# Patient Record
Sex: Male | Born: 1970 | Hispanic: Yes | Marital: Single | Smoking: Former smoker
Health system: Southern US, Community
[De-identification: ages and names within clinical notes are randomized; demographics above are authoritative.]

## PROBLEM LIST (undated history)

## (undated) DIAGNOSIS — K649 Unspecified hemorrhoids: Secondary | ICD-10-CM

## (undated) DIAGNOSIS — J189 Pneumonia, unspecified organism: Secondary | ICD-10-CM

## (undated) DIAGNOSIS — F419 Anxiety disorder, unspecified: Secondary | ICD-10-CM

## (undated) HISTORY — PX: HERNIA REPAIR: SHX51

---

## 2014-09-30 ENCOUNTER — Emergency Department (HOSPITAL_COMMUNITY): Payer: Worker's Compensation

## 2014-09-30 ENCOUNTER — Emergency Department (HOSPITAL_COMMUNITY)
Admission: EM | Admit: 2014-09-30 | Discharge: 2014-09-30 | Disposition: A | Discharge status: Home / Self Care | Payer: Worker's Compensation | Attending: Emergency Medicine | Admitting: Emergency Medicine

## 2014-09-30 ENCOUNTER — Encounter (HOSPITAL_COMMUNITY): Payer: Self-pay | Admitting: *Deleted

## 2014-09-30 DIAGNOSIS — Y9389 Activity, other specified: Secondary | ICD-10-CM | POA: Diagnosis not present

## 2014-09-30 DIAGNOSIS — Y998 Other external cause status: Secondary | ICD-10-CM | POA: Insufficient documentation

## 2014-09-30 DIAGNOSIS — Y9289 Other specified places as the place of occurrence of the external cause: Secondary | ICD-10-CM | POA: Insufficient documentation

## 2014-09-30 DIAGNOSIS — S63501A Unspecified sprain of right wrist, initial encounter: Secondary | ICD-10-CM

## 2014-09-30 DIAGNOSIS — S6991XA Unspecified injury of right wrist, hand and finger(s), initial encounter: Secondary | ICD-10-CM | POA: Diagnosis present

## 2014-09-30 DIAGNOSIS — W208XXA Other cause of strike by thrown, projected or falling object, initial encounter: Secondary | ICD-10-CM | POA: Diagnosis not present

## 2014-09-30 MED ORDER — HYDROCODONE-ACETAMINOPHEN 5-325 MG PO TABS: 2.0000 | ORAL_TABLET | ORAL | Status: DC | PRN

## 2014-09-30 NOTE — Progress Notes (Signed)
Orthopedic Tech Progress Note Patient Details:  Vincent Wang 09/30/1970 [id number]  Ortho Devices Type of Ortho Device: Thumb velcro splint Ortho Device/Splint Location: RUE Ortho Device/Splint Interventions: Application   Asia R Thompson 09/30/2014, 1:53 PM

## 2014-09-30 NOTE — ED Notes (Signed)
Phlebotomy called to come do urine drug test for workers comp per patient employer request.

## 2014-09-30 NOTE — ED Notes (Signed)
Pt in c/o injury to his right wrist, possible deformity noted, able to move all fingers but states he cannot bend his wrist down due to pain, sensation intact, no distress noted

## 2014-09-30 NOTE — ED Provider Notes (Signed)
CSN: [id number]     Arrival date & time 09/30/14  1206 History  This chart was scribed for Trisha MangleKaren Sophia, PA-C, working with Purvis SheffieldForrest Harrison, MD by Elon SpannerGarrett Cook, ED Scribe. This patient was seen in room TR09C/TR09C and the patient's care was started at 1:29 PM.   Chief Complaint  Patient presents with  . Wrist Injury   The history is provided by the patient. No language interpreter was used.   HPI Comments: Vincent Wang is a 44 y.o. male who presents to the Emergency Department complaining of a right wrist injury sustained 4.5 hours ago as the gate of a truck fell onto his thumb, causing his thumb and wrist to hyperextend.  He reports the pain the right wrist and thumb that is worsened with any motion.  He has not taken anything for this complaint.  Patient denies previous history of cyst.    History reviewed. No pertinent past medical history. History reviewed. No pertinent past surgical history. History reviewed. No pertinent family history. History  Substance Use Topics  . Smoking status: Never Smoker   . Smokeless tobacco: Not on file  . Alcohol Use: Not on file    Review of Systems  Musculoskeletal: Positive for arthralgias.  All other systems reviewed and are negative.     Allergies  Review of patient's allergies indicates no known allergies.  Home Medications   Prior to Admission medications   Not on File   BP 148/96 mmHg  Pulse 100  Temp(Src) 98.5 F (36.9 C) (Oral)  Resp 18  SpO2 97% Physical Exam  Constitutional: He is oriented to person, place, and time. He appears well-developed and well-nourished. No distress.  HENT:  Head: Normocephalic and atraumatic.  Eyes: Conjunctivae and EOM are normal.  Neck: Neck supple. No tracheal deviation present.  Cardiovascular: Normal rate.   Pulmonary/Chest: Effort normal. No respiratory distress.  Musculoskeletal: Normal range of motion.  Dime sized, swollen, raised, cystic feeling area on wrist below thumb.  Decreased  thumb flexion and decreased ROM of wrist.  NVI.  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    Neurological: He is alert and oriented to person, place, and time.  Skin: Skin is warm and dry.  Psychiatric: He has a normal mood and affect. His behavior is normal.  Nursing note and vitals reviewed.   ED Course  Procedures (including critical care time)  DIAGNOSTIC STUDIES: Oxygen Saturation is 97% on RA, normal by my interpretation.    COORDINATION OF CARE:  1:35 PM Discussed treatment plan with patient at bedside.  Patient acknowledges and agrees with plan.    Labs Review Labs Reviewed - No data to display  Imaging Review Dg Wrist Complete Right  09/30/2014   CLINICAL DATA:  Distal wrist pain, injury at work trying to open a trailer door  EXAM: RIGHT WRIST - COMPLETE 3+ VIEW  COMPARISON:  None.  FINDINGS: Four views of the right wrist submitted. No acute fracture or subluxation. No radiopaque foreign body.  IMPRESSION: Negative.   Electronically Signed   By: Natasha MeadLiviu  Pop M.D.   On: 09/30/2014 13:01   Dg Hand Complete Right  09/30/2014   CLINICAL DATA:  Injury at work trying to open a trailer door, prior injury of fourth finger  EXAM: RIGHT HAND - COMPLETE 3+ VIEW  COMPARISON:  None.  FINDINGS: Three views of the right hand submitted. No acute fracture or subluxation. There is prior amputation of distal phalanx fourth finger.  IMPRESSION: Negative.  Electronically Signed   By: Natasha MeadLiviu  Pop M.D.   On: 09/30/2014 13:02     EKG Interpretation None      MDM   Final diagnoses:  Wrist sprain, right, initial encounter    Schedule to see Dr. Amanda PeaGramig for evaluation.   Return if any problems. Thumb spica splint Hydrocodone    Elson AreasLeslie K Sofia, PA-C 09/30/14 1515  Purvis SheffieldForrest Harrison, MD 09/30/14 [phone]

## 2014-09-30 NOTE — ED Notes (Signed)
Called orthopedics, to place black velcro thumb spica splint.   Currently, we do not have any in med room.   Ortho on their way.

## 2014-09-30 NOTE — Discharge Instructions (Signed)
Esguince de los ligamentos (Ligament Sprain) Los ligamentos son tejidos fibrosos y resistentes que sostienen los huesos juntos en las articulaciones. Cuando un ligamento se estira, puede ocurrir un esguince. Esta lesin puede tardar varias semanas en curarse.  INSTRUCCIONES PARA EL CUIDADO DOMICILIARIO  Mantenga en reposo la zona afectada durante el tiempo que se lo indique su mdico. Luego comience a Chemical engineerutilizar esa articulacin lentamente del modo en que se lo indic el mdico y a Dentistmedida que el dolor se lo permita.  Si es posible, mantenga la articulacin afectada elevada para disminuir la hinchazn.  Aplique hielo durante 15 a 20 minutos para Engineer, materialsaliviar el dolor cada dos horas durante el primer medio da, luego 3 a 4 veces por Environmental consultantda durante las primeras 48 horas. Ponga el hielo en una bolsa plstica y coloque una toalla entre la bolsa y la piel.  Use la tablilla, el yeso, o el vendaje elstico segn se le haya indicado.  Utilice los medicamentos de venta libre o de prescripcin para Chief Technology Officerel dolor, Environmental health practitionerel malestar o la [place]fiebre, segn se lo indique el profesional que lo asiste. No tome aspirina inmediatamente despus de la lesin a menos que se lo haya indicado su mdico. La aspirina puede ocasionarle un aumento en el sangrado y moretones en los tejidos.  Si se le aconsej la utilizacin de Kellytonmuletas, contine usndolas segn las instrucciones y no vuelva a soportar peso sobre la extremidad Dillard'slesionada hasta que se le indique que puede Suttonhacerlo. SOLICITE ATENCIN MDICA SI:  Aumentan los moretones, la hinchazn o Chief Technology Officerel dolor.  Siente los dedos de la pierna o el brazo lesionados fros o adormecidos. SOLICITE ATENCIN MDICA DE INMEDIATO SI:  Los dedos del pie de la pierna lesionada estn adormecidos o de color azul.  Los dedos de la mano del brazo lesionado estn adormecidos o de Research officer, trade unioncolor azul.  El dolor no responde a los medicamentos y sigue igual o Insurance underwriterempeora. EST SEGURO QUE:   Comprende las instrucciones para el  alta mdica.  Controlar su enfermedad.  Solicitar atencin mdica de inmediato segn las indicaciones. Document Released: 05/02/2005 Document Revised: 07/25/2011 [facility] Patient Information 2015 [place], [place]. This information is not intended to replace advice given to you by your health care provider. Make sure you discuss any questions you have with your health care provider. Wrist Pain Wrist injuries are frequent in adults and children. A sprain is an injury to the ligaments that hold your bones together. A strain is an injury to muscle or muscle cord-like structures (tendons) from stretching or pulling. Generally, when wrists are moderately tender to touch following a fall or injury, a break in the bone (fracture) may be present. Most wrist sprains or strains are better in 3 to 5 days, but complete healing may take several weeks. HOME CARE INSTRUCTIONS   Put ice on the injured area.  Put ice in a plastic bag.  Place a towel between your skin and the bag.  Leave the ice on for 15-20 minutes, 3-4 times a day, for the first 2 days, or as directed by your health care provider.  Keep your arm raised above the level of your heart whenever possible to reduce swelling and pain.  Rest the injured area for at least 48 hours or as directed by your health care provider.  If a splint or elastic bandage has been applied, use it for as long as directed by your health care provider or until seen by a health care provider for a follow-up exam.  Only  take over-the-counter or prescription medicines for pain, discomfort, or fever as directed by your health care provider.  Keep all follow-up appointments. You may need to follow up with a specialist or have follow-up X-rays. Improvement in pain level is not a guarantee that you did not fracture a bone in your wrist. The only way to determine whether or not you have a broken bone is by X-ray. SEEK IMMEDIATE MEDICAL CARE IF:   Your fingers are  swollen, very red, white, or cold and blue.  Your fingers are numb or tingling.  You have increasing pain.  You have difficulty moving your fingers. MAKE SURE YOU:   Understand these instructions.  Will watch your condition.  Will get help right away if you are not doing well or get worse. Document Released: 02/09/2005 Document Revised: 05/07/2013 Document Reviewed: 06/23/2010 [facility] Patient Information 2015 [place], [place]. This information is not intended to replace advice given to you by your health care provider. Make sure you discuss any questions you have with your health care provider.

## 2014-09-30 NOTE — ED Notes (Signed)
Ortho tech in room applying splint. 

## 2015-06-22 ENCOUNTER — Other Ambulatory Visit: Payer: Self-pay | Admitting: Orthopedic Surgery

## 2015-07-14 ENCOUNTER — Encounter (HOSPITAL_COMMUNITY): Payer: Self-pay

## 2015-07-14 ENCOUNTER — Encounter (HOSPITAL_COMMUNITY)
Admission: RE | Admit: 2015-07-14 | Discharge: 2015-07-14 | Disposition: A | Discharge status: Home / Self Care | Payer: Worker's Compensation | Source: Ambulatory Visit | Attending: Orthopedic Surgery | Admitting: Orthopedic Surgery

## 2015-07-14 DIAGNOSIS — M654 Radial styloid tenosynovitis [de Quervain]: Secondary | ICD-10-CM | POA: Diagnosis not present

## 2015-07-14 DIAGNOSIS — Z01812 Encounter for preprocedural laboratory examination: Secondary | ICD-10-CM | POA: Diagnosis not present

## 2015-07-14 HISTORY — DX: Pneumonia, unspecified organism: J18.9

## 2015-07-14 HISTORY — DX: Anxiety disorder, unspecified: F41.9

## 2015-07-14 HISTORY — DX: Unspecified hemorrhoids: K64.9

## 2015-07-14 LAB — COMPREHENSIVE METABOLIC PANEL
ALT: 60 U/L (ref 17–63)
AST: 59 U/L — AB (ref 15–41)
Albumin: 4.1 g/dL (ref 3.5–5.0)
Alkaline Phosphatase: 89 U/L (ref 38–126)
Anion gap: 10 (ref 5–15)
BUN: 9 mg/dL (ref 6–20)
CHLORIDE: 102 mmol/L (ref 101–111)
CO2: 25 mmol/L (ref 22–32)
CREATININE: 1.1 mg/dL (ref 0.61–1.24)
Calcium: 9.8 mg/dL (ref 8.9–10.3)
GFR calc Af Amer: >60 mL/min (ref >60)
Glucose, Bld: 93 mg/dL (ref 65–99)
Potassium: 3.8 mmol/L (ref 3.5–5.1)
Sodium: 137 mmol/L (ref 135–145)
Total Bilirubin: 0.4 mg/dL (ref 0.3–1.2)
Total Protein: 9 g/dL — ABNORMAL HIGH (ref 6.5–8.1)

## 2015-07-14 LAB — CBC
HEMATOCRIT: 44.8 % (ref 39.0–52.0)
HEMOGLOBIN: 15.5 g/dL (ref 13.0–17.0)
MCH: 30.9 pg (ref 26.0–34.0)
MCHC: 34.6 g/dL (ref 30.0–36.0)
MCV: 89.2 fL (ref 78.0–100.0)
Platelets: 276 10*3/uL (ref 150–400)
RBC: 5.02 MIL/uL (ref 4.22–5.81)
RDW: 13.2 % (ref 11.5–15.5)
WBC: 14.8 10*3/uL — ABNORMAL HIGH (ref 4.0–10.5)

## 2015-07-14 NOTE — Pre-Procedure Instructions (Addendum)
Vincent Wang  07/14/2015      [id number] - [name],  - [address] AT [facility] [address] [name] [address] Phone: [phone] Fax: [phone]    Your procedure is scheduled on Friday, March 10th   Report to [facility] Admitting at 9:45 AM   Call this number if you have problems the morning of surgery:  [phone]   Remember:  Do not eat food or drink liquids after midnight Thursday.   Take these medicines the morning of surgery with A SIP OF WATER : Ativan, Flomax, Pain medication and please use inhaler that morning.   Do not wear jewelry - no rings or watches.  Do not wear lotions or colognes.   You may NOT wear deodorant the day of surgery.              Men may shave face and neck.  Do not bring valuables to the hospital.  [facility] is not responsible for any belongings or valuables.  Contacts, dentures or bridgework may not be worn into surgery.  Leave your suitcase in the car.  After surgery it may be brought to your room. For patients admitted to the hospital, discharge time will be determined by your treatment team.  Name and phone number of your driver:     Please read over the following fact sheets that you were given. Pain Booklet and Surgical Site Infection Prevention

## 2015-07-14 NOTE — Progress Notes (Addendum)
Patient has been feeling 'sick' --flu symptoms.  Coughing a lot, yellow sputum.  Has not been to urgent care to determine exactly etiology.  No real fever (has no way to check). 10 mths ago, patient went to [facility] for pneumonia.  Was given "some pills" and was discharged.  Did not stay overnight.  "Feels like fever in my muscles & bones" Afebrile today at visit. 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

## 2015-07-23 MED ORDER — CEFAZOLIN SODIUM-DEXTROSE 2-3 GM-% IV SOLR
2.0000 g | INTRAVENOUS | Status: AC
  Administered 2015-07-24: 2 g via INTRAVENOUS
  Filled 2015-07-23: qty 50

## 2015-07-23 MED ORDER — SODIUM CHLORIDE 0.45 % IV SOLN: INTRAVENOUS | Status: DC

## 2015-07-24 ENCOUNTER — Ambulatory Visit (HOSPITAL_COMMUNITY): Payer: Worker's Compensation | Admitting: Anesthesiology

## 2015-07-24 ENCOUNTER — Encounter (HOSPITAL_COMMUNITY): Payer: Self-pay | Admitting: *Deleted

## 2015-07-24 ENCOUNTER — Encounter (HOSPITAL_COMMUNITY)
Admission: RE | Disposition: A | Discharge status: Home / Self Care | Payer: Self-pay | Source: Ambulatory Visit | Attending: Orthopedic Surgery

## 2015-07-24 ENCOUNTER — Observation Stay (HOSPITAL_COMMUNITY)
Admission: RE | Admit: 2015-07-24 | Discharge: 2015-07-25 | Disposition: A | Discharge status: Home / Self Care | Payer: Worker's Compensation | Source: Ambulatory Visit | Attending: Orthopedic Surgery | Admitting: Orthopedic Surgery

## 2015-07-24 DIAGNOSIS — X58XXXA Exposure to other specified factors, initial encounter: Secondary | ICD-10-CM | POA: Insufficient documentation

## 2015-07-24 DIAGNOSIS — M25339 Other instability, unspecified wrist: Secondary | ICD-10-CM | POA: Diagnosis present

## 2015-07-24 DIAGNOSIS — S63391A Traumatic rupture of other ligament of right wrist, initial encounter: Secondary | ICD-10-CM | POA: Diagnosis not present

## 2015-07-24 DIAGNOSIS — S63511A Sprain of carpal joint of right wrist, initial encounter: Principal | ICD-10-CM | POA: Insufficient documentation

## 2015-07-24 DIAGNOSIS — Y990 Civilian activity done for income or pay: Secondary | ICD-10-CM | POA: Insufficient documentation

## 2015-07-24 DIAGNOSIS — Z87891 Personal history of nicotine dependence: Secondary | ICD-10-CM | POA: Insufficient documentation

## 2015-07-24 DIAGNOSIS — F419 Anxiety disorder, unspecified: Secondary | ICD-10-CM | POA: Insufficient documentation

## 2015-07-24 DIAGNOSIS — Z6836 Body mass index (BMI) 36.0-36.9, adult: Secondary | ICD-10-CM | POA: Insufficient documentation

## 2015-07-24 HISTORY — PX: DORSAL COMPARTMENT RELEASE: SHX5039

## 2015-07-24 SURGERY — RELEASE, FIRST DORSAL COMPARTMENT, HAND
Anesthesia: Regional | Site: Hand | Laterality: Right

## 2015-07-24 MED ORDER — BUPIVACAINE HCL (PF) 0.25 % IJ SOLN
INTRAMUSCULAR | Status: AC
  Filled 2015-07-24: qty 30

## 2015-07-24 MED ORDER — OXYCODONE HCL 5 MG PO TABS
5.0000 mg | ORAL_TABLET | ORAL | Status: DC | PRN
  Administered 2015-07-24: 5 mg via ORAL
  Administered 2015-07-24 – 2015-07-25 (×3): 10 mg via ORAL
  Filled 2015-07-24 (×3): qty 2

## 2015-07-24 MED ORDER — FAMOTIDINE 20 MG PO TABS: 20.0000 mg | ORAL_TABLET | Freq: Two times a day (BID) | ORAL | Status: DC | PRN

## 2015-07-24 MED ORDER — DEXAMETHASONE SODIUM PHOSPHATE 4 MG/ML IJ SOLN
INTRAMUSCULAR | Status: AC
  Filled 2015-07-24: qty 2

## 2015-07-24 MED ORDER — BUPIVACAINE-EPINEPHRINE (PF) 0.5% -1:200000 IJ SOLN
INTRAMUSCULAR | Status: DC | PRN
  Administered 2015-07-24: 25 mL via PERINEURAL

## 2015-07-24 MED ORDER — OXYCODONE HCL 5 MG PO TABS
ORAL_TABLET | ORAL | Status: AC
  Filled 2015-07-24: qty 1

## 2015-07-24 MED ORDER — ONDANSETRON HCL 4 MG/2ML IJ SOLN
INTRAMUSCULAR | Status: AC
  Filled 2015-07-24: qty 2

## 2015-07-24 MED ORDER — MIDAZOLAM HCL 2 MG/2ML IJ SOLN
INTRAMUSCULAR | Status: AC
  Administered 2015-07-24: 2 mg
  Filled 2015-07-24: qty 2

## 2015-07-24 MED ORDER — CEFAZOLIN SODIUM 1-5 GM-% IV SOLN
1.0000 g | INTRAVENOUS | Status: DC
  Filled 2015-07-24: qty 50

## 2015-07-24 MED ORDER — CHLORHEXIDINE GLUCONATE 4 % EX LIQD: 60.0000 mL | Freq: Once | CUTANEOUS | Status: DC

## 2015-07-24 MED ORDER — MIDAZOLAM HCL 5 MG/5ML IJ SOLN
INTRAMUSCULAR | Status: DC | PRN
  Administered 2015-07-24: 2 mg via INTRAVENOUS

## 2015-07-24 MED ORDER — PROPOFOL 10 MG/ML IV BOLUS
INTRAVENOUS | Status: AC
  Filled 2015-07-24: qty 20

## 2015-07-24 MED ORDER — ALPRAZOLAM 0.5 MG PO TABS
0.5000 mg | ORAL_TABLET | Freq: Four times a day (QID) | ORAL | Status: DC | PRN
  Administered 2015-07-24 – 2015-07-25 (×2): 0.5 mg via ORAL
  Filled 2015-07-24 (×2): qty 1

## 2015-07-24 MED ORDER — PROMETHAZINE HCL 25 MG/ML IJ SOLN
6.2500 mg | INTRAMUSCULAR | Status: DC | PRN
Start: 2015-07-24 — End: 2015-07-24

## 2015-07-24 MED ORDER — MORPHINE SULFATE (PF) 2 MG/ML IV SOLN
1.0000 mg | INTRAVENOUS | Status: DC | PRN
  Administered 2015-07-24: 1 mg via INTRAVENOUS
  Filled 2015-07-24: qty 1

## 2015-07-24 MED ORDER — PHENYLEPHRINE HCL 10 MG/ML IJ SOLN
10.0000 mg | INTRAVENOUS | Status: DC | PRN
  Administered 2015-07-24: 20 ug/min via INTRAVENOUS

## 2015-07-24 MED ORDER — ALBUTEROL SULFATE HFA 108 (90 BASE) MCG/ACT IN AERS
INHALATION_SPRAY | RESPIRATORY_TRACT | Status: AC
  Filled 2015-07-24: qty 6.7

## 2015-07-24 MED ORDER — LIDOCAINE HCL (CARDIAC) 20 MG/ML IV SOLN
INTRAVENOUS | Status: AC
  Filled 2015-07-24: qty 5

## 2015-07-24 MED ORDER — FENTANYL CITRATE (PF) 250 MCG/5ML IJ SOLN
INTRAMUSCULAR | Status: AC
  Filled 2015-07-24: qty 5

## 2015-07-24 MED ORDER — LACTATED RINGERS IV SOLN: INTRAVENOUS | Status: DC

## 2015-07-24 MED ORDER — MIDAZOLAM HCL 2 MG/2ML IJ SOLN
INTRAMUSCULAR | Status: AC
  Filled 2015-07-24: qty 2

## 2015-07-24 MED ORDER — PHENYLEPHRINE HCL 10 MG/ML IJ SOLN
INTRAMUSCULAR | Status: DC | PRN
  Administered 2015-07-24 (×5): 80 ug via INTRAVENOUS

## 2015-07-24 MED ORDER — DEXAMETHASONE SODIUM PHOSPHATE 4 MG/ML IJ SOLN
INTRAMUSCULAR | Status: DC | PRN
  Administered 2015-07-24: 8 mg via INTRAVENOUS

## 2015-07-24 MED ORDER — ONDANSETRON HCL 4 MG/2ML IJ SOLN
INTRAMUSCULAR | Status: DC | PRN
  Administered 2015-07-24: 4 mg via INTRAVENOUS

## 2015-07-24 MED ORDER — FENTANYL CITRATE (PF) 100 MCG/2ML IJ SOLN: 25.0000 ug | INTRAMUSCULAR | Status: DC | PRN

## 2015-07-24 MED ORDER — METHOCARBAMOL 500 MG PO TABS
500.0000 mg | ORAL_TABLET | Freq: Four times a day (QID) | ORAL | Status: DC | PRN
  Administered 2015-07-24 – 2015-07-25 (×2): 500 mg via ORAL
  Filled 2015-07-24 (×2): qty 1

## 2015-07-24 MED ORDER — METHOCARBAMOL 1000 MG/10ML IJ SOLN
500.0000 mg | Freq: Four times a day (QID) | INTRAVENOUS | Status: DC | PRN
  Filled 2015-07-24: qty 5

## 2015-07-24 MED ORDER — ALBUTEROL SULFATE HFA 108 (90 BASE) MCG/ACT IN AERS
INHALATION_SPRAY | RESPIRATORY_TRACT | Status: DC | PRN
  Administered 2015-07-24 (×5): 6 via RESPIRATORY_TRACT

## 2015-07-24 MED ORDER — LACTATED RINGERS IV SOLN
INTRAVENOUS | Status: DC
Start: 2015-07-24 — End: 2015-07-24
  Administered 2015-07-24 (×2): via INTRAVENOUS

## 2015-07-24 MED ORDER — LIDOCAINE HCL (CARDIAC) 20 MG/ML IV SOLN
INTRAVENOUS | Status: DC | PRN
Start: 2015-07-24 — End: 2015-07-24
  Administered 2015-07-24: 100 mg via INTRAVENOUS

## 2015-07-24 MED ORDER — DOCUSATE SODIUM 100 MG PO CAPS
100.0000 mg | ORAL_CAPSULE | Freq: Two times a day (BID) | ORAL | Status: DC
  Administered 2015-07-24 – 2015-07-25 (×2): 100 mg via ORAL
  Filled 2015-07-24 (×2): qty 1

## 2015-07-24 MED ORDER — FENTANYL CITRATE (PF) 100 MCG/2ML IJ SOLN
INTRAMUSCULAR | Status: DC | PRN
  Administered 2015-07-24: 50 ug via INTRAVENOUS

## 2015-07-24 MED ORDER — 0.9 % SODIUM CHLORIDE (POUR BTL) OPTIME
TOPICAL | Status: DC | PRN
  Administered 2015-07-24 (×2): 1000 mL

## 2015-07-24 MED ORDER — CEFAZOLIN SODIUM 1-5 GM-% IV SOLN
1.0000 g | Freq: Three times a day (TID) | INTRAVENOUS | Status: DC
  Administered 2015-07-24 – 2015-07-25 (×3): 1 g via INTRAVENOUS
  Filled 2015-07-24 (×4): qty 50

## 2015-07-24 MED ORDER — PROPOFOL 10 MG/ML IV BOLUS
INTRAVENOUS | Status: DC | PRN
  Administered 2015-07-24: 100 mg via INTRAVENOUS
  Administered 2015-07-24: 200 mg via INTRAVENOUS

## 2015-07-24 MED ORDER — FENTANYL CITRATE (PF) 100 MCG/2ML IJ SOLN
INTRAMUSCULAR | Status: AC
  Administered 2015-07-24: 50 ug
  Filled 2015-07-24: qty 2

## 2015-07-24 MED ORDER — VITAMIN C 500 MG PO TABS
1000.0000 mg | ORAL_TABLET | Freq: Every day | ORAL | Status: DC
  Administered 2015-07-25: 1000 mg via ORAL
  Filled 2015-07-24 (×2): qty 2

## 2015-07-24 SURGICAL SUPPLY — 67 items
ANCHOR DX SWIVELOCK SL 3.5X8.5 (Anchor) ×15 IMPLANT
BANDAGE ACE 3X5.8 VEL STRL LF (GAUZE/BANDAGES/DRESSINGS) ×3 IMPLANT
BANDAGE ACE 4X5 VEL STRL LF (GAUZE/BANDAGES/DRESSINGS) ×3 IMPLANT
BANDAGE ELASTIC 3 VELCRO ST LF (GAUZE/BANDAGES/DRESSINGS) ×6 IMPLANT
BANDAGE ELASTIC 4 VELCRO ST LF (GAUZE/BANDAGES/DRESSINGS) ×3 IMPLANT
BNDG GAUZE ELAST 4 BULKY (GAUZE/BANDAGES/DRESSINGS) ×3 IMPLANT
CORDS BIPOLAR (ELECTRODE) ×6 IMPLANT
COVER SURGICAL LIGHT HANDLE (MISCELLANEOUS) ×3 IMPLANT
CUFF TOURNIQUET SINGLE 18IN (TOURNIQUET CUFF) ×3 IMPLANT
DISPOSABLE KIT FOR BIOTENODESIS SCREW ×6 IMPLANT
DRAPE SURG 17X23 STRL (DRAPES) ×3 IMPLANT
DRSG ADAPTIC 3X8 NADH LF (GAUZE/BANDAGES/DRESSINGS) ×3 IMPLANT
FIBERLOOP 2 0 (SUTURE) ×3 IMPLANT
GAUZE SPONGE 4X4 12PLY STRL (GAUZE/BANDAGES/DRESSINGS) ×3 IMPLANT
GAUZE XEROFORM 1X8 LF (GAUZE/BANDAGES/DRESSINGS) ×3 IMPLANT
GLOVE BIO SURGEON STRL SZ 6.5 (GLOVE) ×2 IMPLANT
GLOVE BIO SURGEONS STRL SZ 6.5 (GLOVE) ×1
GLOVE BIOGEL M STRL SZ7.5 (GLOVE) ×3 IMPLANT
GLOVE BIOGEL PI IND STRL 7.0 (GLOVE) ×3 IMPLANT
GLOVE BIOGEL PI INDICATOR 7.0 (GLOVE) ×6
GLOVE SS BIOGEL STRL SZ 8 (GLOVE) ×1 IMPLANT
GLOVE SUPERSENSE BIOGEL SZ 8 (GLOVE) ×2
GOWN STRL REUS W/ TWL LRG LVL3 (GOWN DISPOSABLE) ×2 IMPLANT
GOWN STRL REUS W/ TWL XL LVL3 (GOWN DISPOSABLE) ×3 IMPLANT
GOWN STRL REUS W/TWL LRG LVL3 (GOWN DISPOSABLE) ×4
GOWN STRL REUS W/TWL XL LVL3 (GOWN DISPOSABLE) ×6
K-WIRE .045 CH (WIRE) ×12
K-WIRE 1.6 (WIRE) ×4
K-WIRE FX5X1.6XNS BN SS (WIRE) ×2
KIT ASCP FXDISP 3X8XBTNDS (KITS) ×2 IMPLANT
KIT BASIN OR (CUSTOM PROCEDURE TRAY) ×3 IMPLANT
KIT BIO-TENODESIS 3X8 DISP (KITS) ×4
KIT ROOM TURNOVER OR (KITS) ×3 IMPLANT
KWIRE .045 CH (WIRE) ×4 IMPLANT
KWIRE FX5X1.6XNS BN SS (WIRE) ×2 IMPLANT
LOOP VESSEL MAXI BLUE (MISCELLANEOUS) IMPLANT
NEEDLE HYPO 25GX1X1/2 BEV (NEEDLE) IMPLANT
NS IRRIG 1000ML POUR BTL (IV SOLUTION) ×3 IMPLANT
PACK ORTHO EXTREMITY (CUSTOM PROCEDURE TRAY) ×3 IMPLANT
PAD ARMBOARD 7.5X6 YLW CONV (MISCELLANEOUS) ×6 IMPLANT
PAD CAST 3X4 CTTN HI CHSV (CAST SUPPLIES) ×1 IMPLANT
PAD CAST 4YDX4 CTTN HI CHSV (CAST SUPPLIES) ×1 IMPLANT
PADDING CAST COTTON 3X4 STRL (CAST SUPPLIES) ×2
PADDING CAST COTTON 4X4 STRL (CAST SUPPLIES) ×2
SLING ARM FOAM STRAP LRG (SOFTGOODS) ×3 IMPLANT
SOLUTION BETADINE 4OZ (MISCELLANEOUS) ×3 IMPLANT
SPLINT FIBERGLASS 4X15 (CAST SUPPLIES) ×3 IMPLANT
SPLINT PLASTER EXTRA FAST 3X15 (CAST SUPPLIES) ×2
SPLINT PLASTER GYPS XFAST 3X15 (CAST SUPPLIES) ×1 IMPLANT
SPONGE GAUZE 4X4 12PLY STER LF (GAUZE/BANDAGES/DRESSINGS) ×3 IMPLANT
SPONGE SCRUB IODOPHOR (GAUZE/BANDAGES/DRESSINGS) ×3 IMPLANT
SUCTION FRAZIER HANDLE 10FR (MISCELLANEOUS)
SUCTION TUBE FRAZIER 10FR DISP (MISCELLANEOUS) IMPLANT
SUT FIBERWIRE 3-0 18 TAPR NDL (SUTURE) ×3
SUT PROLENE 4 0 PS 2 18 (SUTURE) ×9 IMPLANT
SUT VIC AB 2-0 CT1 27 (SUTURE)
SUT VIC AB 2-0 CT1 TAPERPNT 27 (SUTURE) IMPLANT
SUT VIC AB 3-0 FS2 27 (SUTURE) IMPLANT
SUTURE FIBERWR 3-0 18 TAPR NDL (SUTURE) ×1 IMPLANT
SYR CONTROL 10ML LL (SYRINGE) IMPLANT
SYSTEM CHEST DRAIN TLS 7FR (DRAIN) IMPLANT
TOWEL OR 17X24 6PK STRL BLUE (TOWEL DISPOSABLE) ×3 IMPLANT
TOWEL OR 17X26 10 PK STRL BLUE (TOWEL DISPOSABLE) ×3 IMPLANT
TUBE CONNECTING 12'X1/4 (SUCTIONS)
TUBE CONNECTING 12X1/4 (SUCTIONS) IMPLANT
TUBE EVACUATION TLS (MISCELLANEOUS) ×3 IMPLANT
UNDERPAD 30X30 INCONTINENT (UNDERPADS AND DIAPERS) ×3 IMPLANT

## 2015-07-24 NOTE — Progress Notes (Signed)
CRNA at bedside preparing to take pt to OR. 

## 2015-07-24 NOTE — H&P (Signed)
Vincent Wang is an 45 y.o. male.   Chief Complaint: Right wrist and hand pain HPI: Patient presents for evaluation and treatment of his right hand and wrist.  The patient has a MRI documented scapholunate ligament tear. He also has chronic first dorsal compartment pain after an on-the-job injury.  I discussed him risk and benefits of surgical and nonsurgical intervention.  He is failed conservative management for quite some time and desires to proceed with interoperative look at the scapholunate ligament with repair is necessary and first dorsal compartment release  He notes no other pain complaints today.  He is had a mild cough for 2 weeks. We have noted this. I discussed his care with anesthesia. He has no wheeze and no evidence of production in terms of thick mucous present time. He is afebrile. He notes no chills body aches or flu symptoms.  I discussed the patient all issues and concerns.  Past Medical History  Diagnosis Date  . Pneumonia     [facility]   . Anxiety   . Hemorrhoids     with occasional bleeding    Past Surgical History  Procedure Laterality Date  . Hernia repair      right inguinal 2001    History reviewed. No pertinent family history. Social History:  reports that he quit smoking about 5 years ago. He does not have any smokeless tobacco history on file. He reports that he does not drink alcohol or use illicit drugs.  Allergies: No Known Allergies  Medications Prior to Admission  Medication Sig Dispense Refill  . alprazolam (XANAX) 2 MG tablet Take 2 mg by mouth at bedtime as needed for sleep.    . Pseudoeph-Doxylamine-DM-APAP (NYQUIL PO) Take 15 mLs by mouth 2 (two) times daily as needed (for cold symptoms).    . tamsulosin (FLOMAX) 0.4 MG CAPS capsule Take 0.4 mg by mouth daily.  0    No results found for this or any previous visit (from the past 48 hour(s)). No results found.  Review of Systems  Eyes: Negative.    Respiratory: Positive for cough. Negative for hemoptysis, sputum production and shortness of breath.   Cardiovascular: Negative.   Gastrointestinal: Negative.   Genitourinary: Negative.   Endo/Heme/Allergies: Negative.     Blood pressure 123/76, pulse 83, temperature 98.2 F (36.8 C), temperature source Oral, resp. rate 20, height 5\' 5"  (1.651 m), weight 100.245 kg (221 lb), SpO2 95 %. Physical Exam  Pain of the dorsal wrist. Scaphoid shift test produces pain there is no evidence of instability or infection no evidence of vascular compromise.  I've reviewed all issues with him at length and the findings.  He is intact about her exam. His first dorsal compartment is tender to palpation and with Finkelstein's to a mild degree.  The patient is alert and oriented in no acute distress. The patient complains of pain in the affected upper extremity.  The patient is noted to have a normal HEENT exam. Lung fields show equal chest expansion and no shortness of breath. Abdomen exam is nontender without distention. Lower extremity examination does not show any fracture dislocation or blood clot symptoms. Pelvis is stable and the neck and back are stable and nontender. Assessment/Plan Will plan for interoperative first dorsal compartment release and tenosynovectomy as well as interoperative look scapholunate ligament. We'll plan for scapholunate interosseous ligament reconstruction with tendon graft as necessary. Risk and benefits of been discussed all questions encouraged and answered. I've conferred with  anesthesia in regards to his cough and we both feel comfortable moving ahead.  He will stay overnight. Her questions have encouraged and answered.  We are planning surgery for your upper extremity. The risk and benefits of surgery to include risk of bleeding, infection, anesthesia,  damage to normal structures and failure of the surgery to accomplish its intended goals of relieving symptoms and  restoring function have been discussed in detail. With this in mind we plan to proceed. I have specifically discussed with the patient the pre-and postoperative regime and the dos and don'ts and risk and benefits in great detail. Risk and benefits of surgery also include risk of dystrophy(CRPS), chronic nerve pain, failure of the healing process to go onto completion and other inherent risks of surgery The relavent the pathophysiology of the disease/injury process, as well as the alternatives for treatment and postoperative course of action has been discussed in great detail with the patient who desires to proceed.  We will do everything in our power to help you (the patient) restore function to the upper extremity. It is a pleasure to see this patient today.   Karen Chafe, MD 07/24/2015, 12:11 PM

## 2015-07-24 NOTE — Op Note (Signed)
See dictation [phone] GramigMD

## 2015-07-24 NOTE — Anesthesia Procedure Notes (Addendum)
Procedure Name: LMA Insertion Date/Time: 07/24/2015 12:37 PM Performed by: Wilder GladeWINN, THOMAS G Pre-anesthesia Checklist: Patient identified, Emergency Drugs available, Suction available, Patient being monitored and Timeout performed Patient Re-evaluated:Patient Re-evaluated prior to inductionOxygen Delivery Method: Circle system utilized Preoxygenation: Pre-oxygenation with 100% oxygen Intubation Type: IV induction Ventilation: Mask ventilation without difficulty LMA: LMA inserted LMA Size: 5.0 Tube type: Oral Number of attempts: 1 Placement Confirmation: positive ETCO2 and breath sounds checked- equal and bilateral ETT to lip (cm): yes. Tube secured with: Tape Dental Injury: Teeth and Oropharynx as per pre-operative assessment    Anesthesia Regional Block:  Supraclavicular block  Pre-Anesthetic Checklist: ,, timeout performed, Correct Patient, Correct Site, Correct Laterality, Correct Procedure, Correct Position, site marked, Risks and benefits discussed,  Surgical consent,  Pre-op evaluation,  At surgeon's request and post-op pain management  Laterality: Right  Prep: chloraprep       Needles:  Injection technique: Single-shot  Needle Type: Echogenic Stimulator Needle     Needle Length: 9cm 9 cm Needle Gauge: 21 and 21 G    Additional Needles:  Procedures: ultrasound guided (picture in chart) Supraclavicular block Narrative:  Injection made incrementally with aspirations every 5 mL.  Performed by: Personally  Anesthesiologist: Orchid Glassberg  Additional Notes: Risks, benefits and alternative to block explained extensively.  Patient tolerated procedure well, without complications.

## 2015-07-24 NOTE — Anesthesia Preprocedure Evaluation (Addendum)
Anesthesia Evaluation  Patient identified by MRN, date of birth, ID band Patient awake    Reviewed: Allergy & Precautions, H&P , NPO status , Patient's Chart, lab work & pertinent test results  History of Anesthesia Complications Negative for: history of anesthetic complications  Airway Mallampati: II  TM Distance: >3 FB Neck ROM: full    Dental no notable dental hx.    Pulmonary neg pulmonary ROS, former smoker,    Pulmonary exam normal breath sounds clear to auscultation       Cardiovascular negative cardio ROS Normal cardiovascular exam Rhythm:regular Rate:Normal     Neuro/Psych negative neurological ROS     GI/Hepatic negative GI ROS, Neg liver ROS,   Endo/Other  Morbid obesity  Renal/GU negative Renal ROS     Musculoskeletal   Abdominal   Peds  Hematology negative hematology ROS (+)   Anesthesia Other Findings Patient reports a chronic off and on cough that he relates to seasonal allergies, denies chest pain, syncope, dyspnea, or orthopnea.Marland Kitchen. He is afebrile with normal WBC, on exam I can not note any wheezing or rales His mets are greater than 4.. After discussion with patient and Dr. Amanda PeaGramig they would both like to proceed understanding slight increased risk of periop oxygen need, risk of bronchospasm.. Will attempt to avoid trachea irritation with LMA and regional block  Reproductive/Obstetrics negative OB ROS                           Anesthesia Physical Anesthesia Plan  ASA: III  Anesthesia Plan: General and Regional   Post-op Pain Management: GA combined w/ Regional for post-op pain   Induction: Intravenous  Airway Management Planned: LMA  Additional Equipment:   Intra-op Plan:   Post-operative Plan: Extubation in OR  Informed Consent: I have reviewed the patients History and Physical, chart, labs and discussed the procedure including the risks, benefits and alternatives  for the proposed anesthesia with the patient or authorized representative who has indicated his/her understanding and acceptance.   Dental Advisory Given  Plan Discussed with: Anesthesiologist, CRNA and Surgeon  Anesthesia Plan Comments:         Anesthesia Quick Evaluation

## 2015-07-24 NOTE — Transfer of Care (Signed)
Immediate Anesthesia Transfer of Care Note  Patient: Vincent Wang  Procedure(s) Performed: Procedure(s): RIGHT FIRST DORSAL COMPARTMENT RELEASE (DEQUERVAIN)/WITH TENOSYNOVECTOMY/RIGHT SCAPHOLUNATE EVALUTION AND RECONSTRUCTION WITH PIN NEURECTOMY AND ARTHROTOMY AND SYNOVECTOMY (Right)  Patient Location: PACU  Anesthesia Type:General  Level of Consciousness: awake  Airway & Oxygen Therapy: Patient Spontanous Breathing and Patient connected to face mask oxygen  Post-op Assessment: Report given to RN, Post -op Vital signs reviewed and stable and Patient moving all extremities X 4  Post vital signs: stable  Last Vitals:  Filed Vitals:   07/24/15 1215 07/24/15 1519  BP: 152/87 129/72  Pulse: 100 106  Temp:  36.6 C  Resp: 20 19    Complications: No apparent anesthesia complications

## 2015-07-24 NOTE — Op Note (Signed)
NAMJoselyn Glassman:  Bekele, Rajesh               ACCOUNT NO.:  [id number]  MEDICAL RECORD NO.:  [id number]  LOCATION:  [id number]                        FACILITY:  [facility]  PHYSICIAN:  Dionne AnoWilliam M. Carmine Carrozza, M.D.DATE OF BIRTH:  29-May-1970  DATE OF PROCEDURE: DATE OF DISCHARGE:                              OPERATIVE REPORT   PREOPERATIVE DIAGNOSES: 1. Scapholunate interosseous ligament tear, right wrist. 2. 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  POSTOPERATIVE DIAGNOSES: 1. Scapholunate interosseous ligament tear, right wrist. 2. 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  PROCEDURE: 1. Right wrist scapholunate interosseous ligament reconstruction     utilizing an Arthrex system fixation set and an ECRB pinning graft. 2. Capsulorrhaphy of the right wrist. 3. Post interosseous nerve neurectomy, right wrist. 4. First dorsal compartment release right wrist. 5. Pinning of scapholunate and scaphocapitate joint, right wrist. 6. AP, lateral, lateral, and oblique x-rays performed, examined, and     interpreted by myself, right wrist.  SURGEON:  Dionne AnoWilliam M. Amanda PeaGramig, MD  ASSISTANT:  None.  COMPLICATIONS:  None.  ANESTHESIA:  General with preoperative block.  TOURNIQUET TIME:  Less than 2 hours.  DRAINS:  One TLS drain.  INDICATIONS:  Pleasant male presents with above-mentioned diagnosis.  I have counseled him with regard to risks and benefits of surgery and he desires to proceed.  He has had a long course of conservative care unfortunately, this has been to no avail.  MRI does document scapholunate abnormality as noted in the preoperative notes.  I have counseled the patient with regard to risks and benefits of surgery, surgical, nonsurgical pathways were discussed and he desires to proceed.  OPERATIVE PROCEDURE:  Patient was seen by myself and Anesthesia, taken to the operative suite, underwent smooth induction of  general anesthesia.  This was an LMA general anesthetic.  He had a preoperative block placed.  Preoperative antibiotics were given.  Preoperatively, I counseled him at length.  He has had a bit of a cough for 2 weeks, but no productive sputum.  No fever, chills.  No flu-like symptoms and he has clear lung fields.  Once under general anesthetic, he was prepped with Hibiclens and prescrubbed followed by 10 minutes surgical Betadine scrub and paint. Once this was done, final time-out was observed and the arm was elevated, tourniquet was insufflated to 250 mmHg.  He has a short stout forearm, this was gently mobilized into proper position and a dorsal incision was made about the wrist.  Dissection was carried down, interval between the subcutaneous tissues and the extensor retinaculum was created.  Following this, I identified the EPL and EDC step cut the extensor retinaculum, identified the posterior interosseous nerve and performed a PIN neurectomy with crushing and cauterization technique. Following this, we entered the joint capsule, verified scapholunate injury and complete tear and then 2nd course to repair the area.  At this time, I began harvesting the ECRB tendon, a 2-3 mm strip of tendon was harvested without difficulty from the distal insertion coursing proximally.  I coursed approximately carefully protecting extensor pollicis longus tendon.  Following this, I made a counter incision  dorsal radially and harvested the remaining aspect of the tendon.  I pre-calculated the additional incision and made it more radially due to the fact that the patient has had chronic 1st dorsal compartment pain and we subsequently through this incision released the 1st dorsal compartment.  I should note that the 1st dorsal compartment was evaluated, released off the dorsal edge.  There was a separate compartment for the APL tendon which was released.  The superficial radial nerve was  identified and very carefully retracted without difficulty.  Following this, the patient then underwent a very careful and cautious approach to the extremity with a look back at the bony architecture. Joysticks were placed in the scaphoid and lunate, these were reduced and held, followed by provisional fixation with scaphocapitate and a scapholunate pin.  Once pinned in proper position, the pins were bent upon themselves, placed away from the neurovascular structures, particularly the EPB and the superficial radial nerve structures.  I bent the pins, so that it was nice and smooth at the end.  Following this, I then very carefully and cautiously performed placement of 3 pins, 1 in the distal scaphoid, 1 in the scaphoid proximal pole region, and 1 in the lunate.  These were all checked under radiograph. Following this, we reamed with 3.5 drill bit to a depth of approximately 9 mm.  Once this was complete, we then placed the tendon graft in the scaphoid proximal pole followed by lunate in a reduced position followed by the distal pole of the scaphoid.  Once this was complete, I checked this under radiograph that looked excellent.  I could not have been more pleased.  The patient had good firm sound fixation.  Once this was complete, we then performed very careful and cautious approach to the extremity with irrigation followed by capsulorrhaphy and capsulodesis.  The patient tolerated this nicely.  This was done with 4- 0 FiberWire.  Once was complete, we then deflated the tourniquet, obtained hemostasis with bipolar electrocautery, took final copy x-rays and closed the wounds over a 7 TLS drain.  Both wounds were closed without difficulty.  There were no complicating features.  He was taken to recovery room in stable condition.  The patient will be monitored in recovery room.  We will see him back in the office in 12-14 days.  We will admit him overnight for IV antibiotics, general  postop observation, and other measures.  These notes have been discussed.  All questions have been encouraged and answered.  Pleasure see Mr. Meskill today and participate in his care. This is going to be a 4-6 month healing time until any meaningful heavy duty work would be considered. Sometimes we will permanently restrict patients from excessive loading of the scapholunate.  Given his age of 19 years, I am optimistic about his recovery; however things seem to go very smoothly for him as I discussed with him.  These notes have been discussed.  All questions have been encouraged and answered.     Dionne Ano. Amanda Pea, M.D.     [facility]  D:  07/24/2015  T:  07/24/2015  Job:  [id number]

## 2015-07-25 DIAGNOSIS — S63511A Sprain of carpal joint of right wrist, initial encounter: Secondary | ICD-10-CM | POA: Diagnosis not present

## 2015-07-25 MED ORDER — TETANUS-DIPHTH-ACELL PERTUSSIS 5-2.5-18.5 LF-MCG/0.5 IM SUSP
0.5000 mL | Freq: Once | INTRAMUSCULAR | Status: AC
  Administered 2015-07-25: 0.5 mL via INTRAMUSCULAR
  Filled 2015-07-25: qty 0.5

## 2015-07-25 MED ORDER — OXYCODONE HCL 5 MG PO TABS: 5.0000 mg | ORAL_TABLET | ORAL | Status: AC | PRN

## 2015-07-25 MED ORDER — AZITHROMYCIN 250 MG PO TABS: ORAL_TABLET | ORAL | Status: AC

## 2015-07-25 NOTE — Discharge Summary (Signed)
  Patient has been seen and examined. Patient has pain appropriate to his injury/process. Patient denies new complaints at this present time. I have discussed the care pathway with nursing staff. Patient is appropriate and alert.  We reviewed vital signs and intake output which are stable.  The upper extremity is neurovascularly intact. Refill is normal. There is no signs of compartment syndrome. There is no signs of dystrophy. There is normal sensation.  I have spent a  great deal of time discussing range of motion edema control and other techniques to decrease edema and promote flexion extension of the fingers. Patient understands the importance of elevation range of motion massage and other measures to lessen pain and prevent swelling.  We have also discussed immobilization to appropriate areas involved.  We have discussed with the patient shoulder range of motion to prevent adhesive capsulitis.  The remainder of the examination is normal today without complicating feature.  Drain was removed without difficulty  Patient will be discharged home. Will plan to see the patient back in the office as per discharge instructions (please see discharge instructions).  Patient had an uneventful hospital course. At the time of discharge patient is stable awake alert and oriented in no acute distress. Regular diet will be continued and has been tolerated. Patient will notify should have problems occur. There is no signs of DVT infection or other complication at this juncture.  All questions have been incurred and answered.  Please see discharge med list  DC Dz: SP SLIOLreconstruction right wrist  Aryonna Gunnerson MD

## 2015-07-25 NOTE — Evaluation (Signed)
Occupational Therapy Evaluation Patient Details Name: Vincent Wang [place] MRN: [id number] DOB: 10/24/1970 Today's Date: 07/25/2015    History of Present Illness Pt is a 45 y.o. male s/p Right wrist scapholunate interosseous ligament reconstruction. PMHx: Anxiety, Hemorrhoids.   Clinical Impression   Pt reports he was independent with ADLs PTA. Currently pt overall supervision for safety with functional mobility and min assist for ADLs. Educated pt on elevation for edema control, donning/doffing sling and correct positioning, and R finger and shoulder ROM exercises. Pt planning to d/c home with intermittent supervision from family. Pt ready for d/c from OT standpoint; signing off at this time. Please re-consult if needs change. Thank you for this referral.    Follow Up Recommendations  Supervision - Intermittent;Other (comment) (outpt OT as appropriate)    Equipment Recommendations  None recommended by OT    Recommendations for Other Services       Precautions / Restrictions Precautions Precautions: None Required Braces or Orthoses: Sling Restrictions Weight Bearing Restrictions:  (No WB orders in chart)      Mobility Bed Mobility Overal bed mobility: Needs Assistance Bed Mobility: Supine to Sit     Supine to sit: Min assist;HOB elevated     General bed mobility comments: Min hand held assist for transitioning from supine to sitting position.  Transfers Overall transfer level: Needs assistance Equipment used: None Transfers: Sit to/from Stand Sit to Stand: Supervision         General transfer comment: for safety.    Balance Overall balance assessment: No apparent balance deficits (not formally assessed)                                          ADL Overall ADL's : Needs assistance/impaired Eating/Feeding: Set up;Sitting   Grooming: Supervision/safety;Standing   Upper Body Bathing: Minimal assitance;Sitting Upper Body Bathing Details (indicate  cue type and reason): Educated on sponge bathing or covering RUE with trash bag to prevent moisture. Lower Body Bathing: Minimal assistance;Sit to/from stand   Upper Body Dressing : Minimal assistance;Sitting Upper Body Dressing Details (indicate cue type and reason): Educated on R arm into clothing first. Lower Body Dressing: Minimal assistance;Sit to/from stand Lower Body Dressing Details (indicate cue type and reason): Pt able to doff socks but required assit to don. Reports cousin will assist as needed. Toilet Transfer: Supervision/safety;Ambulation;Regular Toilet   Toileting- ArchitectClothing Manipulation and Hygiene: Supervision/safety;Sit to/from stand       Functional mobility during ADLs: Supervision/safety General ADL Comments: No family present for OT eval. Educated pt on elevation of RUE for edema control, R finger and shoulder ROM exercises, sling donning/doffing and correct positioning; pt verbalized understanding.      Vision     Perception     Praxis      Pertinent Vitals/Pain Pain Assessment: Faces Faces Pain Scale: Hurts even more (with movement of fingers) Pain Location: R hand Pain Descriptors / Indicators: Grimacing Pain Intervention(s): Limited activity within patient's tolerance;Monitored during session;Repositioned     Hand Dominance Right   Extremity/Trunk Assessment Upper Extremity Assessment Upper Extremity Assessment: RUE deficits/detail RUE Deficits / Details: Limited finger ROM secondary to pain. Full shoulder AROM and strength generally WFL. RUE: Unable to fully assess due to pain;Unable to fully assess due to immobilization   Lower Extremity Assessment Lower Extremity Assessment: Overall WFL for tasks assessed   Cervical / Trunk Assessment Cervical / Trunk Assessment: Normal  Communication Communication Communication: Prefers language other than English (Spanish is his first language but does speak some [place])   Cognition Arousal/Alertness:  Awake/alert Behavior During Therapy: Anxious Overall Cognitive Status: Within Functional Limits for tasks assessed                     General Comments       Exercises Exercises: General Upper Extremity     Shoulder Instructions      Home Living Family/patient expects to be discharged to:: Private residence Living Arrangements: Other relatives Available Help at Discharge: Family;Available PRN/intermittently Type of Home: House Home Access: Stairs to enter     Home Layout: One level     Bathroom Shower/Tub: Producer, television/film/video: Standard     Home Equipment: None          Prior Functioning/Environment Level of Independence: Independent             OT Diagnosis: Acute pain   OT Problem List:     OT Treatment/Interventions:      OT Goals(Current goals can be found in the care plan section) Acute Rehab OT Goals Patient Stated Goal: to go home today OT Goal Formulation: With patient  OT Frequency:     Barriers to D/C:            Co-evaluation              End of Session Equipment Utilized During Treatment: Other (comment) (sling)  Activity Tolerance: Patient tolerated treatment well Patient left: in chair;with call bell/phone within reach   Time: 1145-1209 OT Time Calculation (min): 24 min Charges:  OT General Charges $OT Visit: 1 Procedure OT Evaluation $OT Eval Low Complexity: 1 Procedure OT Treatments $Self Care/Home Management : 8-22 mins G-Codes: OT G-codes **NOT FOR INPATIENT CLASS** Functional Assessment Tool Used: Clinical judgement Functional Limitation: Self care Self Care Current Status (Z6109): At least 1 percent but less than 20 percent impaired, limited or restricted Self Care Goal Status (U0454): At least 1 percent but less than 20 percent impaired, limited or restricted Self Care Discharge Status [phone]): At least 1 percent but less than 20 percent impaired, limited or restricted   Gaye Alken  M.S., OTR/L Pager: [phone]  07/25/2015, 12:21 PM

## 2015-07-25 NOTE — Discharge Instructions (Signed)

## 2015-07-25 NOTE — Progress Notes (Signed)
AVS and prescriptions given to patient.  Understanding verbalized with Dr. Amanda PeaGramig and RN.  IV removed. Last dose of Cefazolin given prior to leaving. Belongings packed. Transportation arranged by patient through [employer].

## 2015-07-27 ENCOUNTER — Encounter (HOSPITAL_COMMUNITY): Payer: Self-pay | Admitting: Orthopedic Surgery

## 2015-07-27 NOTE — Anesthesia Postprocedure Evaluation (Signed)
Anesthesia Post Note  Patient: Vincent Wang  Procedure(s) Performed: Procedure(s) (LRB): RIGHT FIRST DORSAL COMPARTMENT RELEASE (DEQUERVAIN)/WITH TENOSYNOVECTOMY/RIGHT SCAPHOLUNATE EVALUTION AND RECONSTRUCTION WITH PIN NEURECTOMY AND ARTHROTOMY AND SYNOVECTOMY (Right)  Patient location during evaluation: PACU Anesthesia Type: General and Regional Level of consciousness: awake and alert Pain management: pain level controlled Vital Signs Assessment: post-procedure vital signs reviewed and stable Respiratory status: spontaneous breathing, nonlabored ventilation, respiratory function stable and patient connected to nasal cannula oxygen Cardiovascular status: blood pressure returned to baseline and stable Postop Assessment: no signs of nausea or vomiting Anesthetic complications: no    Last Vitals:  Filed Vitals:   07/25/15 0730 07/25/15 1410  BP: 142/91 136/90  Pulse: 86 86  Temp: 36.9 C 36.9 C  Resp: 18 18    Last Pain:  Filed Vitals:   07/25/15 1411  PainSc: Asleep                 Vincent Wang, Vincent Wang

## 2015-07-29 ENCOUNTER — Encounter (HOSPITAL_COMMUNITY): Payer: Self-pay | Admitting: Orthopedic Surgery

## 2016-09-17 IMAGING — CR DG WRIST COMPLETE 3+V*R*
4 series · 4 of 4 positions shown · non-contrast
Comparison: None.

CLINICAL DATA: Distal wrist pain, injury at work trying to open a
trailer door

EXAM:
RIGHT WRIST - COMPLETE 3+ VIEW

[wrist pa]
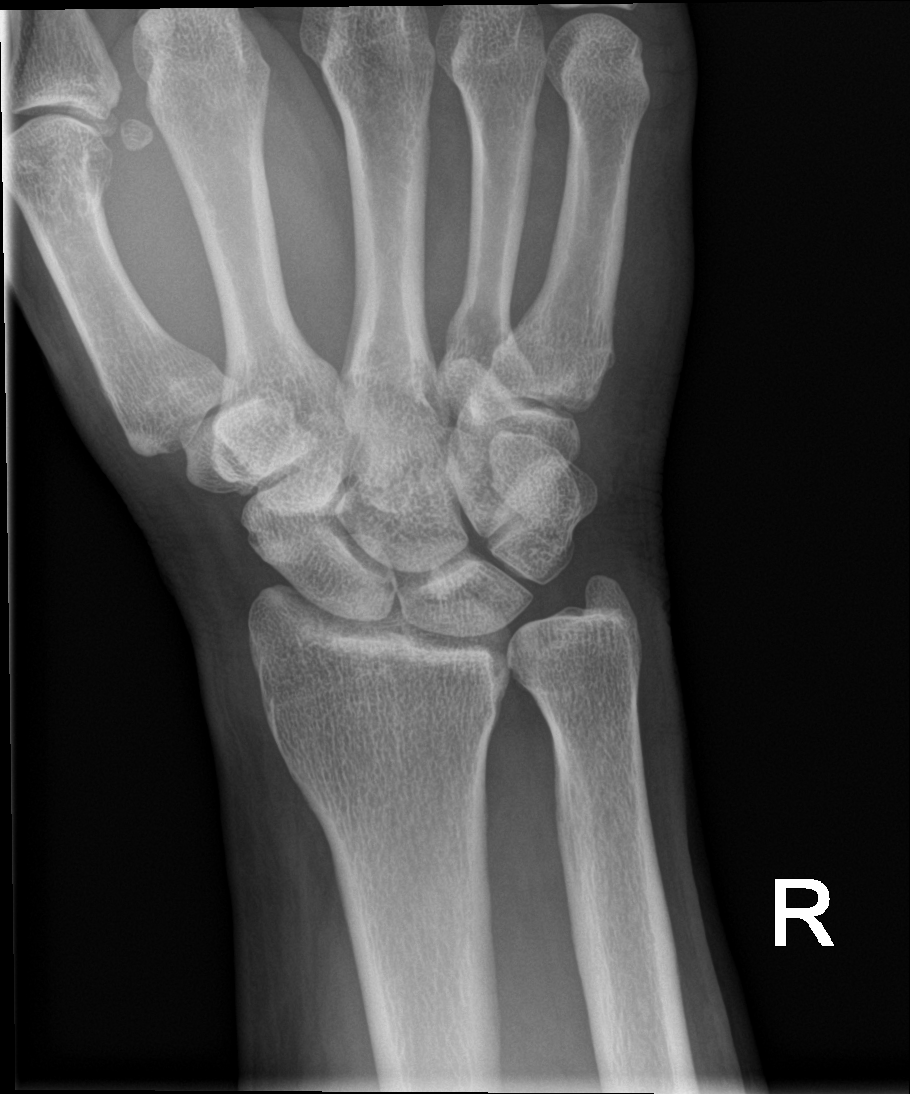

[wrist obl]
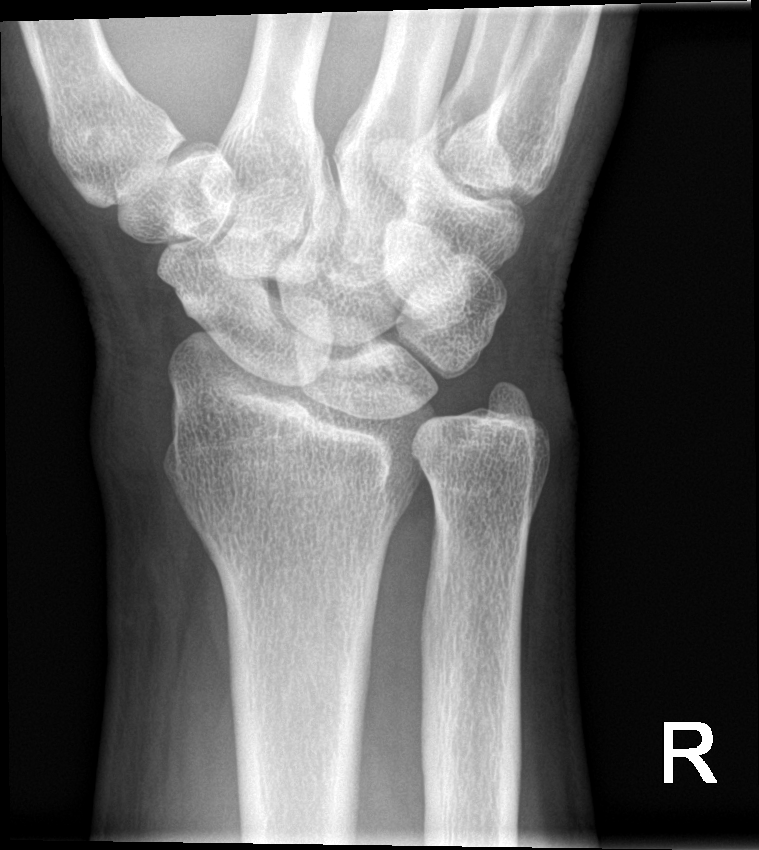

[wrist lat]
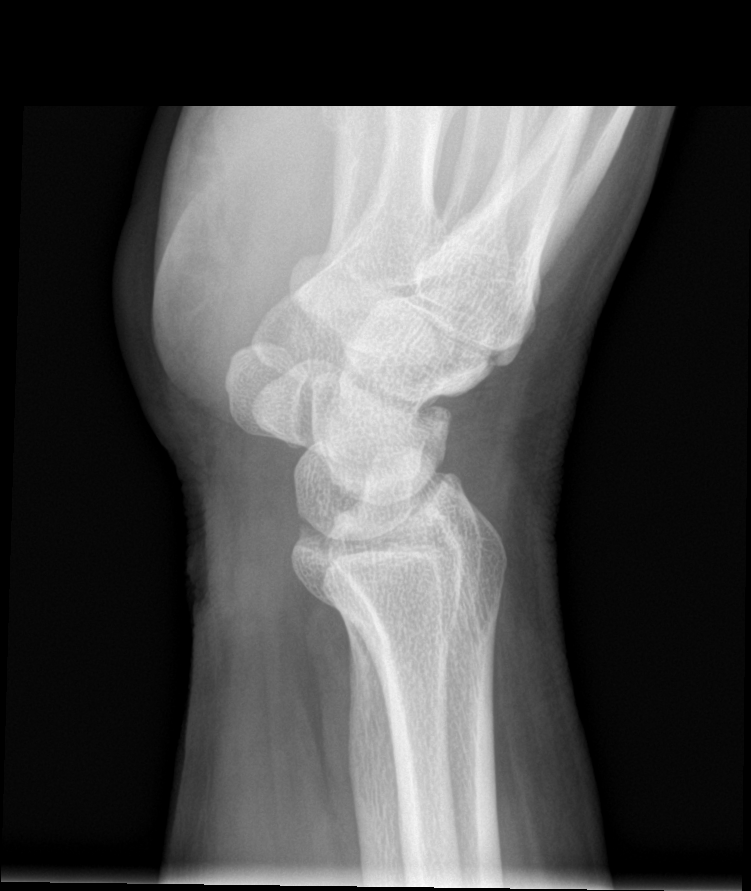

[wrist navicular]
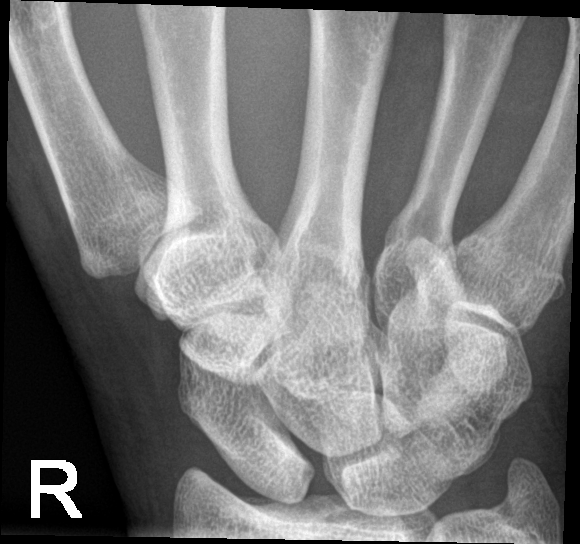

[4 of 4 positions shown; findings below may reference images not displayed]

FINDINGS: Four views of the right wrist submitted. No acute fracture or
subluxation. No radiopaque foreign body.
IMPRESSION: Negative.
